# Patient Record
Sex: Female | Born: 2000 | Race: White | Hispanic: No | Marital: Single | Smoking: Never smoker
Health system: Southern US, Community
[De-identification: ages and names within clinical notes are randomized; demographics above are authoritative.]

---
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
## 2018-01-25 ENCOUNTER — Ambulatory Visit (INDEPENDENT_AMBULATORY_CARE_PROVIDER_SITE_OTHER): Payer: BLUE CROSS/BLUE SHIELD | Admitting: Family Medicine

## 2018-01-25 ENCOUNTER — Encounter: Payer: Self-pay | Admitting: Family Medicine

## 2018-01-25 VITALS — BP 103/62 | HR 69 | Temp 98.2°F | Ht 62.08 in | Wt 127.8 lb

## 2018-01-25 DIAGNOSIS — A084 Viral intestinal infection, unspecified: Secondary | ICD-10-CM

## 2018-01-25 NOTE — Progress Notes (Signed)
BP 103/62   Pulse 69   Temp 98.2 F (36.8 C) (Oral)   Ht 5' 2.08" (1.577 m)   Wt 127 lb 12.8 oz (58 kg)   BMI 23.31 kg/m    Subjective:    Patient ID: Destiny Morris, female    DOB: February 08, 2001, 17 y.o.   MRN: [id number]  HPI: Destiny Morris is a 17 y.o. female presenting on 01/25/2018 for Emesis (x 3 days- Has improved and needs school note); Nausea; and Diarrhea   HPI Nausea and vomiting and stomach virus Destiny Morris is coming in complaining of nausea and vomiting abdominal pain and stomach virus that started 2 days ago and today is day 3 although she admits that everything seems to be improving and she is not having the nausea and vomiting and diarrhea like she had last 2 days and she feels like she is pretty much over it is mainly coming in today because she needs a note for school because she is missed the past 2 days.  She denies any fevers but has had some chills but all of that seems to be improving.  She does admit to being sexually active but is currently taking Depo-Provera and says that she has not missed any days on the Depo-Provera  Relevant past medical, surgical, family and social history reviewed and updated as indicated. Interim medical history since our last visit reviewed. Allergies and medications reviewed and updated.  Review of Systems  Constitutional: Negative for chills and fever.  Eyes: Negative for visual disturbance.  Respiratory: Negative for chest tightness and shortness of breath.   Cardiovascular: Negative for chest pain and leg swelling.  Gastrointestinal: Positive for abdominal pain, diarrhea, nausea and vomiting. Negative for abdominal distention, blood in stool and constipation.  Genitourinary: Negative for difficulty urinating and dysuria.  Musculoskeletal: Negative for back pain and gait problem.  Skin: Negative for rash.  Neurological: Negative for light-headedness and headaches.  Psychiatric/Behavioral: Negative for agitation and behavioral  problems.  All other systems reviewed and are negative.   Per HPI unless specifically indicated above   Allergies as of 01/25/2018      Reactions   Other Hives   Roaches, cats, dogs   Tree Extract       Medication List        Accurate as of 01/25/18  2:35 PM. Always use your most recent med list.          OGESTREL 0.5-50 MG-MCG tablet Generic drug:  norgestrel-ethinyl estradiol TAKE ONE TABLET BY MOUTH DAILY.          Objective:    BP 103/62   Pulse 69   Temp 98.2 F (36.8 C) (Oral)   Ht 5' 2.08" (1.577 m)   Wt 127 lb 12.8 oz (58 kg)   BMI 23.31 kg/m   Wt Readings from Last 3 Encounters:  01/25/18 127 lb 12.8 oz (58 kg) (53 %, Z= 0.08)*  09/09/16 109 lb (49.4 kg) (20 %, Z= -0.83)*  06/17/16 108 lb (49 kg) (19 %, Z= -0.86)*   * Growth percentiles are based on CDC (Girls, 2-20 Years) data.    Physical Exam  Constitutional: She is oriented to person, place, and time. She appears well-developed and well-nourished. No distress.  Eyes: Conjunctivae are normal.  Cardiovascular: Normal rate, regular rhythm, normal heart sounds and intact distal pulses.  No murmur heard. Pulmonary/Chest: Effort normal and breath sounds normal. No respiratory distress. She has no wheezes.  Abdominal: Soft. Bowel sounds are  normal. She exhibits no distension and no mass. There is no tenderness. There is no guarding.  Neurological: She is alert and oriented to person, place, and time. Coordination normal.  Skin: Skin is warm and dry. No rash noted. She is not diaphoretic.  Psychiatric: She has a normal mood and affect. Her behavior is normal.  Nursing note and vitals reviewed.       Assessment & Plan:   Problem List Items Addressed This Visit    None    Visit Diagnoses    Viral gastroenteritis    -  Primary      Will give note for school, she is okay to go back as long as no further symptoms arise.  Sounds like she is already on the mend Follow up plan: Return if symptoms  worsen or fail to improve.  Counseling provided for all of the vaccine components No orders of the defined types were placed in this encounter.   Arville Care, MD [facility] 01/25/2018, 2:35 PM
